# Patient Record
Sex: Female | Born: 1950 | Race: White | Hispanic: No | Marital: Married
Health system: Southern US, Community
[De-identification: ages and names within clinical notes are randomized; demographics above are authoritative.]

---

## 2013-12-23 ENCOUNTER — Ambulatory Visit: Payer: Self-pay | Admitting: Internal Medicine

## 2014-01-04 ENCOUNTER — Ambulatory Visit: Payer: Self-pay | Admitting: Gastroenterology

## 2015-05-09 IMAGING — MG MM DIGITAL SCREENING BILAT W/ CAD
1 series · 6 of 6 positions shown · non-contrast
Comparison: None.

CLINICAL DATA: Screening.

EXAM:
DIGITAL SCREENING BILATERAL MAMMOGRAM WITH CAD

[R CC · right · 6 of 6 slices shown]
[im 1/6]
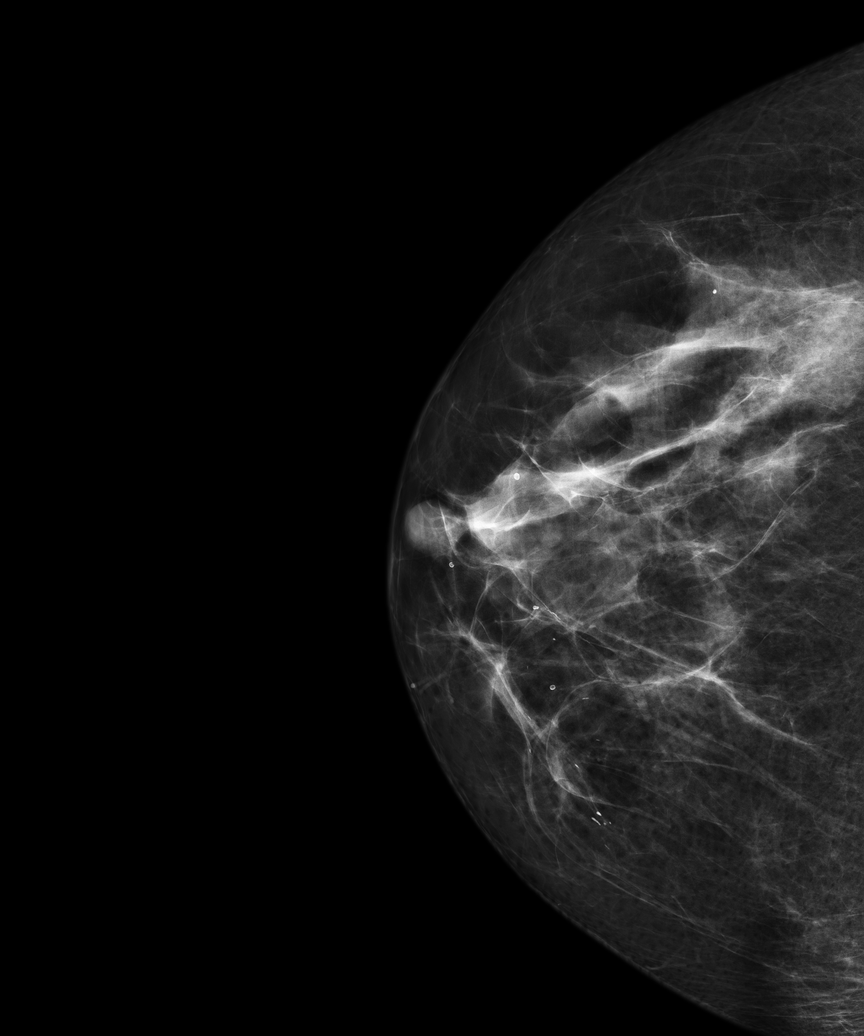
[im 2/6]
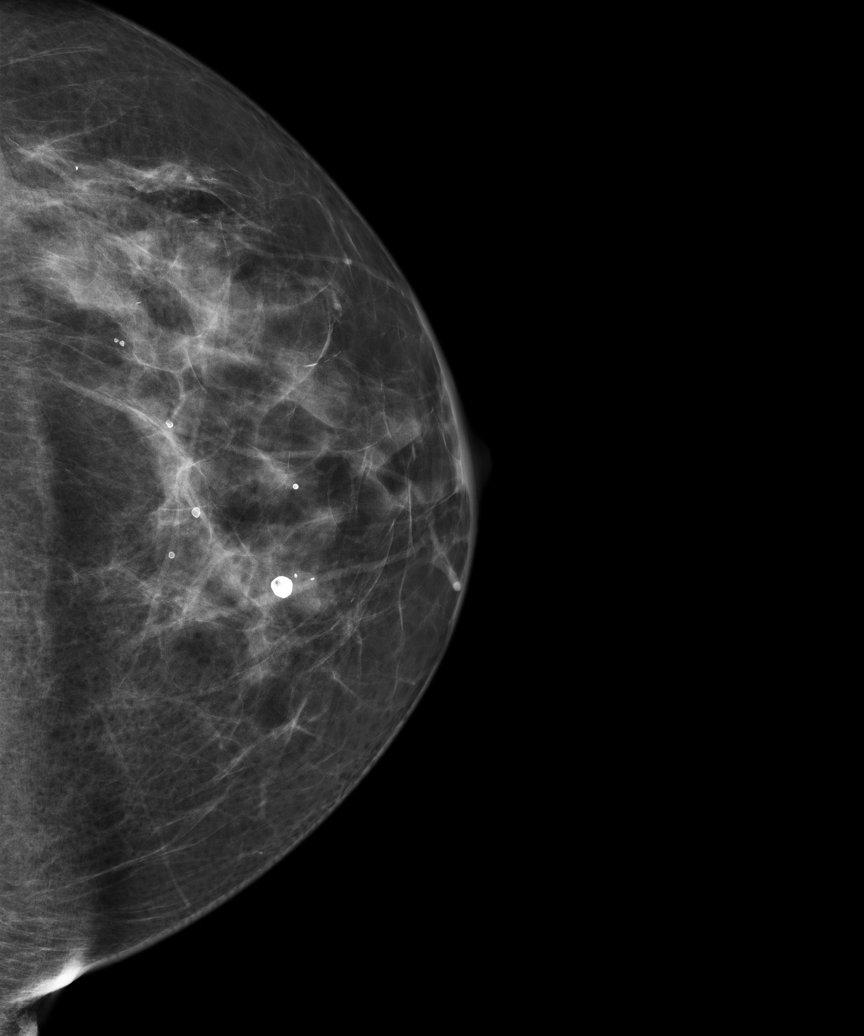
[im 3/6]
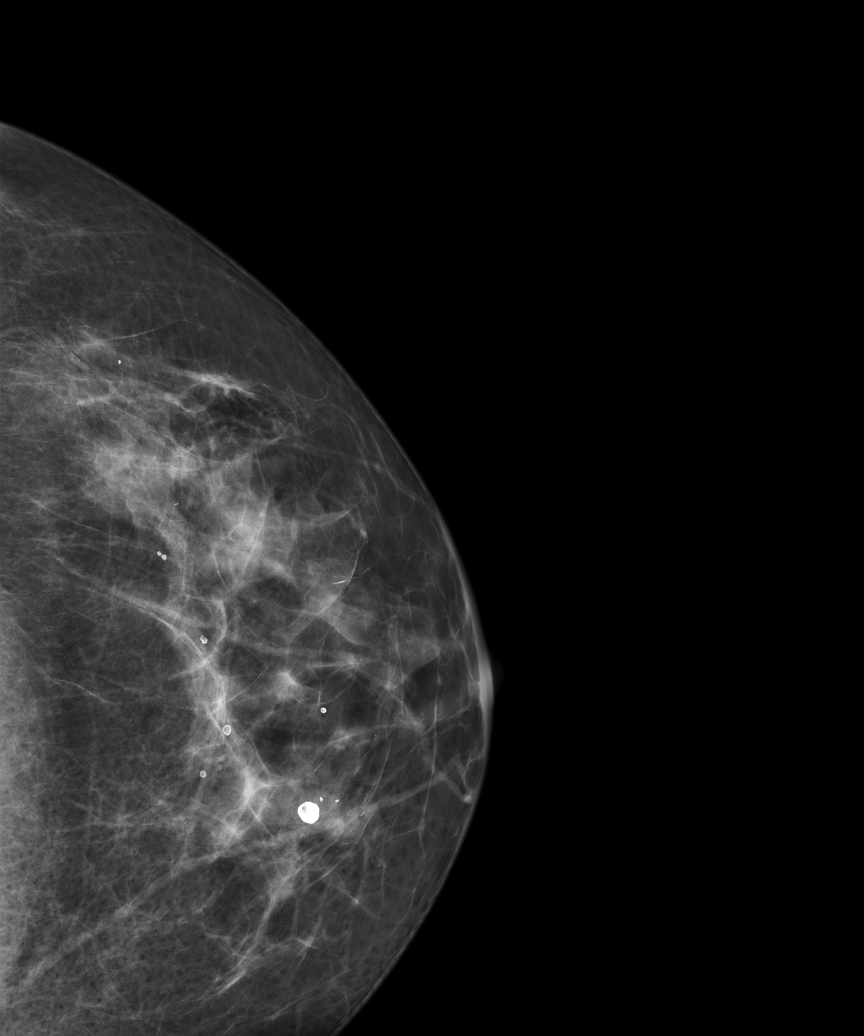
[im 4/6]
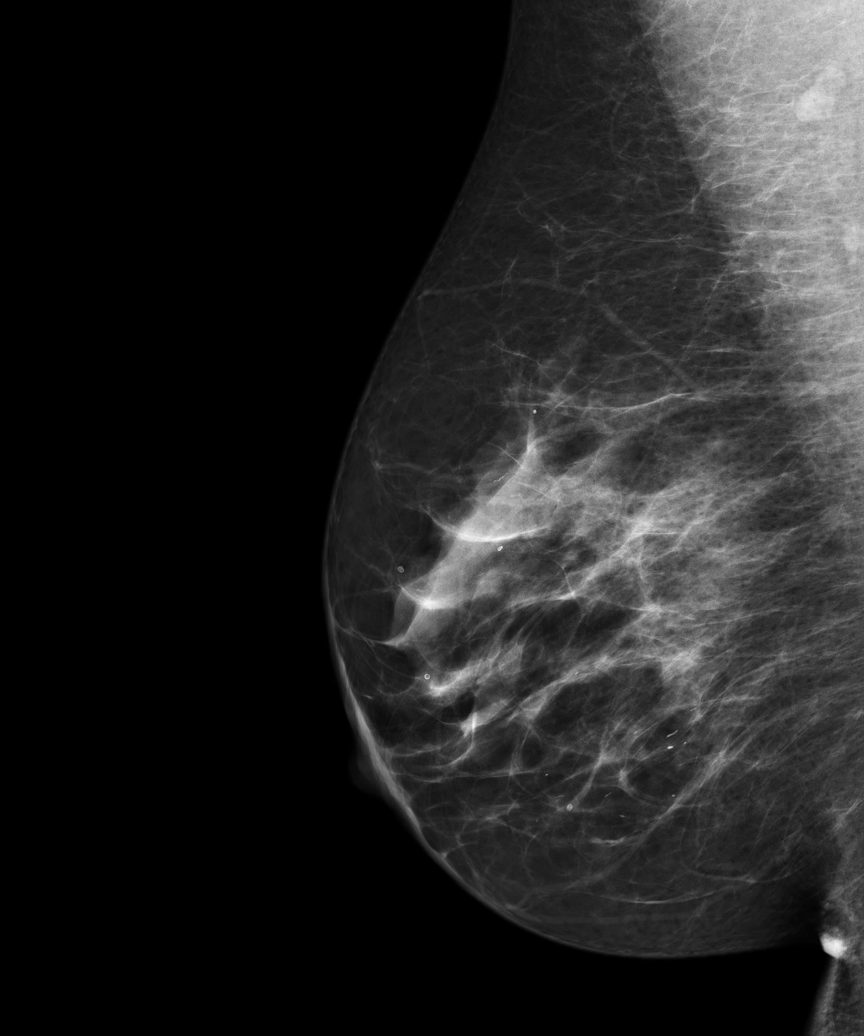
[im 5/6]
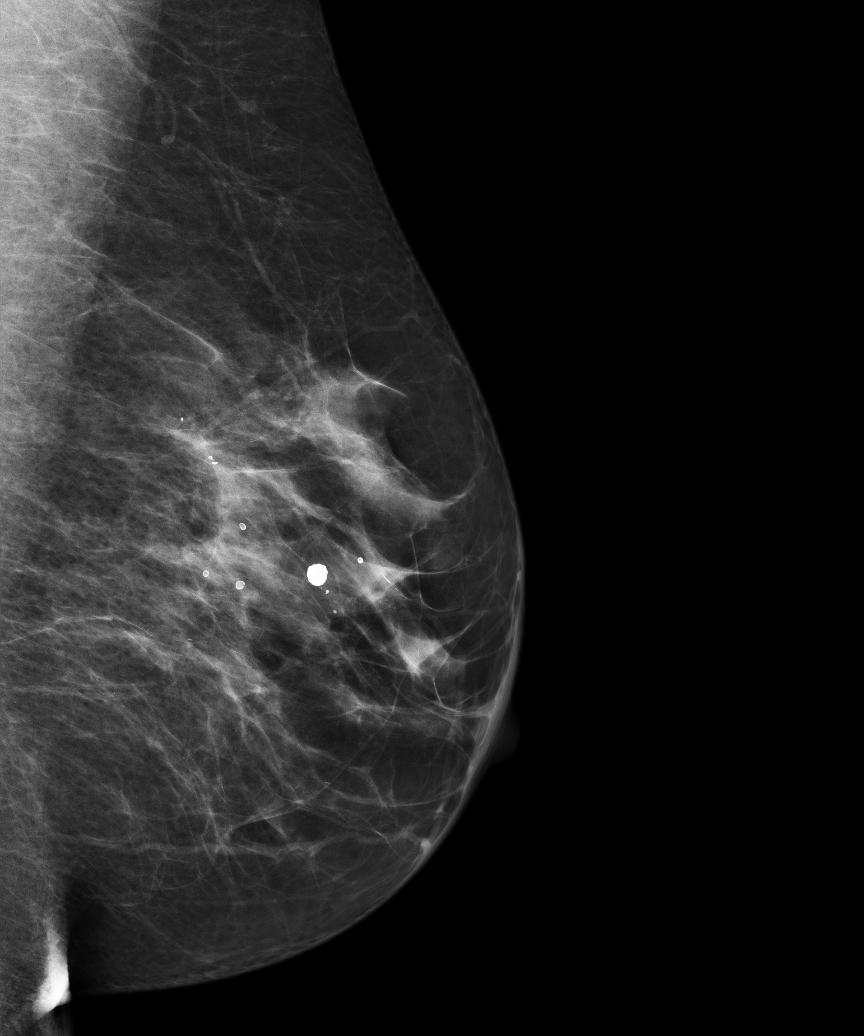
[im 6/6]
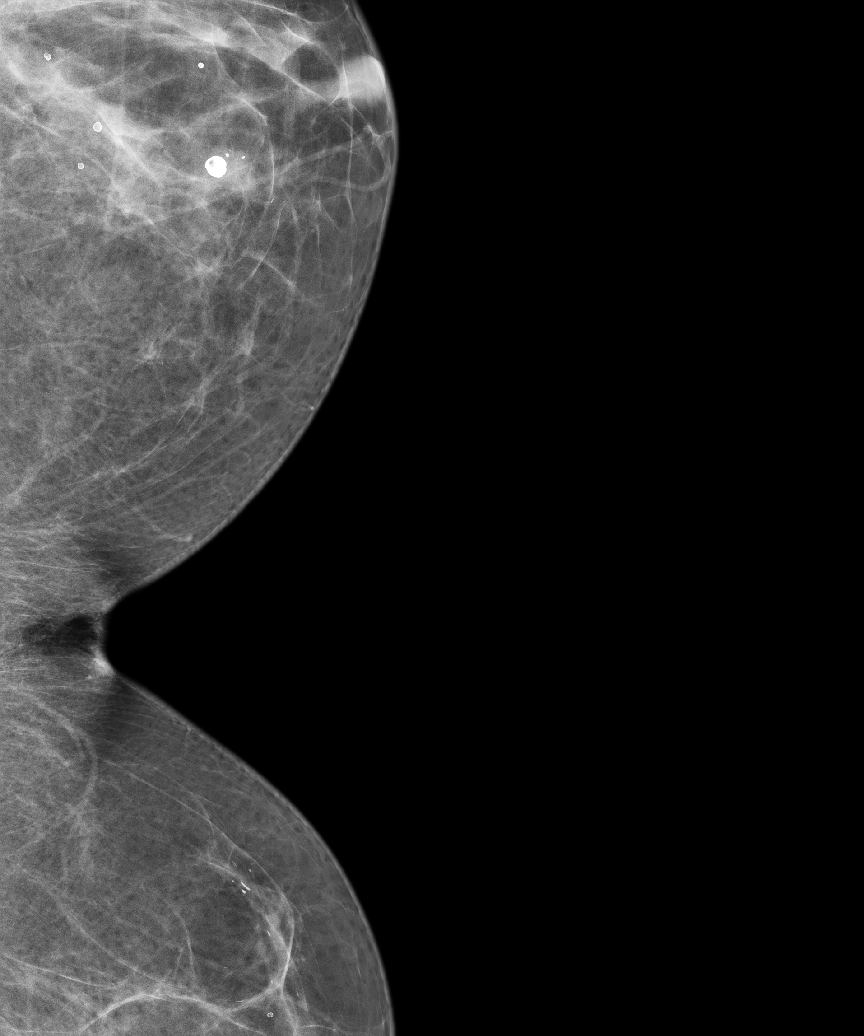

[6 of 6 positions shown; findings below may reference images not displayed]

ACR Breast Density Category b: There are scattered areas of
fibroglandular density.
FINDINGS: There are no findings suspicious for malignancy. Images were
processed with CAD.
IMPRESSION: No mammographic evidence of malignancy. A result letter of this
screening mammogram will be mailed directly to the patient.

RECOMMENDATION:
Screening mammogram in one year. (Code:[id number])

BI-RADS CATEGORY  1: Negative.

## 2019-08-14 ENCOUNTER — Other Ambulatory Visit: Payer: Self-pay | Admitting: Student

## 2019-08-14 DIAGNOSIS — Z1231 Encounter for screening mammogram for malignant neoplasm of breast: Secondary | ICD-10-CM

## 2019-11-09 ENCOUNTER — Ambulatory Visit
Admission: RE | Admit: 2019-11-09 | Discharge: 2019-11-09 | Disposition: A | Discharge status: Home / Self Care | Payer: Medicare Other | Source: Ambulatory Visit | Attending: Student | Admitting: Student

## 2019-11-09 DIAGNOSIS — Z1231 Encounter for screening mammogram for malignant neoplasm of breast: Secondary | ICD-10-CM | POA: Diagnosis present

## 2019-12-28 ENCOUNTER — Ambulatory Visit: Payer: Medicare Other | Attending: Internal Medicine

## 2019-12-28 DIAGNOSIS — Z23 Encounter for immunization: Secondary | ICD-10-CM

## 2019-12-28 NOTE — Progress Notes (Signed)
   Covid-19 Vaccination Clinic  Name:  Sally Moore    MRN: [id number] DOB: 08/01/1951  12/28/2019  [name] was observed post Covid-19 immunization for 15 minutes without incidence. She was provided with Vaccine Information Sheet and instruction to access the V-Safe system.   [name] was instructed to call 911 with any severe reactions post vaccine: Marland Kitchen Difficulty breathing  . Swelling of your face and throat  . A fast heartbeat  . A bad rash all over your body  . Dizziness and weakness    Immunizations Administered    Name Date Dose VIS Date Route   Moderna COVID-19 Vaccine 12/28/2019 11:28 AM 0.5 mL 10/06/2019 Intramuscular   Manufacturer: Moderna   Lot: [id number]   NDC: 49494-473-95

## 2020-01-26 ENCOUNTER — Ambulatory Visit: Payer: Medicare Other | Attending: Internal Medicine

## 2020-01-26 DIAGNOSIS — Z23 Encounter for immunization: Secondary | ICD-10-CM

## 2020-01-26 NOTE — Progress Notes (Signed)
   Covid-19 Vaccination Clinic  Name:  Sally Moore    MRN: [id number] DOB: 03-Dec-1950  01/26/2020  [name] was observed post Covid-19 immunization for 15 minutes without incident. She was provided with Vaccine Information Sheet and instruction to access the V-Safe system.   [name] was instructed to call 911 with any severe reactions post vaccine: Marland Kitchen Difficulty breathing  . Swelling of face and throat  . A fast heartbeat  . A bad rash all over body  . Dizziness and weakness   Immunizations Administered    Name Date Dose VIS Date Route   Moderna COVID-19 Vaccine 01/26/2020  3:06 PM 0.5 mL 10/06/2019 Intramuscular   Manufacturer: Gala Murdoch   Lot: [id number]   NDC: 03833-383-29
